# Patient Record
Sex: Female | Born: 2000 | Hispanic: Yes | Marital: Single | Smoking: Never smoker
Health system: Southern US, Community
[De-identification: ages and names within clinical notes are randomized; demographics above are authoritative.]

## PROBLEM LIST (undated history)

## (undated) ENCOUNTER — Inpatient Hospital Stay: Payer: Self-pay

## (undated) DIAGNOSIS — Z789 Other specified health status: Secondary | ICD-10-CM

## (undated) HISTORY — PX: NO PAST SURGERIES: SHX2092

---
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
## 2020-05-07 ENCOUNTER — Other Ambulatory Visit: Payer: Self-pay

## 2020-05-07 ENCOUNTER — Ambulatory Visit: Payer: Medicaid Other | Admitting: Podiatry

## 2020-05-07 DIAGNOSIS — R102 Pelvic and perineal pain: Secondary | ICD-10-CM | POA: Insufficient documentation

## 2022-12-13 ENCOUNTER — Emergency Department
Admission: EM | Admit: 2022-12-13 | Discharge: 2022-12-13 | Disposition: A | Discharge status: Home / Self Care | Payer: Medicaid Other | Attending: Emergency Medicine | Admitting: Emergency Medicine

## 2022-12-13 ENCOUNTER — Other Ambulatory Visit: Payer: Self-pay

## 2022-12-13 DIAGNOSIS — L509 Urticaria, unspecified: Secondary | ICD-10-CM | POA: Diagnosis not present

## 2022-12-13 DIAGNOSIS — T7840XA Allergy, unspecified, initial encounter: Secondary | ICD-10-CM | POA: Diagnosis present

## 2022-12-13 DIAGNOSIS — T782XXA Anaphylactic shock, unspecified, initial encounter: Secondary | ICD-10-CM

## 2022-12-13 MED ORDER — DIPHENHYDRAMINE HCL 25 MG PO CAPS
50.0000 mg | ORAL_CAPSULE | Freq: Once | ORAL | Status: AC
  Administered 2022-12-13: 50 mg via ORAL
  Filled 2022-12-13: qty 2

## 2022-12-13 MED ORDER — EPINEPHRINE 0.3 MG/0.3ML IJ SOAJ: 0.3000 mg | INTRAMUSCULAR | 1 refills | Status: AC | PRN

## 2022-12-13 MED ORDER — EPINEPHRINE 0.3 MG/0.3ML IJ SOAJ
0.3000 mg | Freq: Once | INTRAMUSCULAR | Status: AC
  Administered 2022-12-13: 0.3 mg via INTRAMUSCULAR
  Filled 2022-12-13: qty 0.3

## 2022-12-13 MED ORDER — DEXAMETHASONE SODIUM PHOSPHATE 10 MG/ML IJ SOLN
10.0000 mg | Freq: Once | INTRAMUSCULAR | Status: AC
  Administered 2022-12-13: 10 mg via INTRAMUSCULAR
  Filled 2022-12-13: qty 1

## 2022-12-13 NOTE — ED Notes (Signed)
ED Provider at bedside. 

## 2022-12-13 NOTE — ED Triage Notes (Addendum)
Pt reports waking up tonight with itching all over and hives. Pt has swelling to left side upper lip. Pt reports feeling like throat is swollen, no obvious swelling noted and voice is clear. Pt reports this started 30 min ago and denies new food or products.

## 2022-12-13 NOTE — ED Provider Notes (Signed)
[facility] Provider Note    Event Date/Time   First MD Initiated Contact with Patient 12/13/22 [phone]     (approximate)   History   Allergic Reaction   HPI  Misty Cummings is a 22 y.o. female who presents to the ED for evaluation of Allergic Reaction   Patient presents to the ED alongside her brother for evaluation of of upper lip swelling, hives, shortness of breath and throat closure sensation.  This has never happened before.  She felt normal when she went to bed, but awoke overnight with the symptoms.   Physical Exam   Triage Vital Signs: ED Triage Vitals  Encounter Vitals Group     BP 12/13/22 0220 103/60     Systolic BP Percentile --      Diastolic BP Percentile --      Pulse Rate 12/13/22 0220 71     Resp 12/13/22 0220 18     Temp 12/13/22 0220 98.4 F (36.9 C)     Temp src --      SpO2 12/13/22 0220 100 %     Weight 12/13/22 0221 163 lb (73.9 kg)     Height 12/13/22 0221 5\' 4"  (1.626 m)     Head Circumference --      Peak Flow --      Pain Score 12/13/22 0220 0     Pain Loc --      Pain Education --      Exclude from Growth Chart --     Most recent vital signs: Vitals:   12/13/22 0300 12/13/22 0521  BP: (!) 104/54 (!) 91/56  Pulse: 69 66  Resp: 17 16  Temp:  98.4 F (36.9 C)  SpO2: 100% 96%    General: Awake, no distress.  CV:  Good peripheral perfusion.  Resp:  Normal effort.  No wheezing Abd:  No distention.  Soft and benign MSK:  No deformity noted.  Neuro:  No focal deficits appreciated. Other:  Scattered hives to the extremities.  Localized area of swelling to the left upper lip without signs of upper airway obstruction.  Normal voice but she frequently swallows and says it feels unusual   ED Results / Procedures / Treatments   Labs (all labs ordered are listed, but only abnormal results are displayed) Labs Reviewed - No data to display  EKG   RADIOLOGY   Official radiology report(s): No  results found.  PROCEDURES and INTERVENTIONS:  .1-3 Lead EKG Interpretation  Performed by: Delton Prairie, MD Authorized by: Delton Prairie, MD     Interpretation: normal     ECG rate:  66   ECG rate assessment: normal     Rhythm: sinus rhythm     Ectopy: none     Conduction: normal   .Critical Care  Performed by: Delton Prairie, MD Authorized by: Delton Prairie, MD   Critical care provider statement:    Critical care time (minutes):  30   Critical care time was exclusive of:  Separately billable procedures and treating other patients   Critical care was necessary to treat or prevent imminent or life-threatening deterioration of the following conditions:  Toxidrome   Critical care was time spent personally by me on the following activities:  Development of treatment plan with patient or surrogate, discussions with consultants, evaluation of patient's response to treatment, examination of patient, ordering and review of laboratory studies, ordering and review of radiographic studies, ordering and performing treatments and interventions, pulse oximetry,  re-evaluation of patient's condition and review of old charts   Medications  EPINEPHrine (EPI-PEN) injection 0.3 mg (0.3 mg Intramuscular Given 12/13/22 0248)  diphenhydrAMINE (BENADRYL) capsule 50 mg (50 mg Oral Given 12/13/22 0248)  dexamethasone (DECADRON) injection 10 mg (10 mg Intramuscular Given 12/13/22 0248)     IMPRESSION / MDM / ASSESSMENT AND PLAN / ED COURSE  I reviewed the triage vital signs and the nursing notes.  Differential diagnosis includes, but is not limited to, atopic reaction, angioedema, anaphylaxis  {Patient presents with symptoms of an acute illness or injury that is potentially life-threatening.  Patient presents with evidence of anaphylaxis requiring an EpiPen and subsequent observation.  Multiple organs involved but no distress.  Symptoms resolved quickly with EpiPen and I doubt angioedema.  We will observe for a  few hours to ensure no need for repeat dosing but I suspect she will be suitable for outpatient management.  Clinical Course as of 12/13/22 8413  [name] Dec 13, 2022  2440 Reassessed, symptoms resolving with the EpiPen.  She was appreciative [DS]  0508 Reassessed, feeling well and suitable for outpatient management.  Discussed EpiPen, return precautions [DS]    Clinical Course User Index [DS] Delton Prairie, MD     FINAL CLINICAL IMPRESSION(S) / ED DIAGNOSES   Final diagnoses:  Anaphylaxis, initial encounter  Hives  Allergic reaction, initial encounter     Rx / DC Orders   ED Discharge Orders          Ordered    EPINEPHrine 0.3 mg/0.3 mL IJ SOAJ injection  As needed        12/13/22 0501             Note:  This document was prepared using Dragon voice recognition software and may include unintentional dictation errors.   Delton Prairie, MD 12/13/22 [phone]

## 2022-12-13 NOTE — Discharge Instructions (Addendum)
If you have any more mild itching or hives, you may need to take Benadryl a couple times over the next day or so.  Sent a prescription for EpiPen to the pharmacy for you to keep around the house  If your symptoms return or worsen then please return to the ED  If you ever use an EpiPen you should come to the ED to get observed

## 2022-12-13 NOTE — ED Notes (Signed)
Provided pt with discharge instructions and education. All of pt questions answered. Pt in possession of all belongings. Pt AAOX4 and stable at time of discharge.Pt ambulated w/ steady gait towards ED exit. Pt accompanied by family member.

## 2024-01-12 ENCOUNTER — Other Ambulatory Visit: Payer: Self-pay

## 2024-01-12 ENCOUNTER — Emergency Department

## 2024-01-12 ENCOUNTER — Emergency Department
Admission: EM | Admit: 2024-01-12 | Discharge: 2024-01-12 | Disposition: A | Discharge status: Home / Self Care | Attending: Emergency Medicine | Admitting: Emergency Medicine

## 2024-01-12 DIAGNOSIS — S6991XA Unspecified injury of right wrist, hand and finger(s), initial encounter: Secondary | ICD-10-CM | POA: Insufficient documentation

## 2024-01-12 DIAGNOSIS — Y92410 Unspecified street and highway as the place of occurrence of the external cause: Secondary | ICD-10-CM | POA: Insufficient documentation

## 2024-01-12 DIAGNOSIS — M79644 Pain in right finger(s): Secondary | ICD-10-CM | POA: Diagnosis present

## 2024-01-12 DIAGNOSIS — M545 Low back pain, unspecified: Secondary | ICD-10-CM | POA: Insufficient documentation

## 2024-01-12 MED ORDER — ACETAMINOPHEN 325 MG PO TABS
650.0000 mg | ORAL_TABLET | Freq: Once | ORAL | Status: AC
  Administered 2024-01-12: 650 mg via ORAL
  Filled 2024-01-12: qty 2
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
## 2024-01-12 NOTE — Discharge Instructions (Signed)
 You may wear the brace for extra support.  Please follow-up with your outpatient provider.  Please return for any new, worsening, or changing symptoms or other concerns.  It was a pleasure caring for you today.
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
## 2024-01-13 ENCOUNTER — Telehealth: Payer: Self-pay | Admitting: Emergency Medicine

## 2024-01-13 NOTE — Telephone Encounter (Addendum)
 Called again re possible finger fracture.  She did answer and I explained.  She is splinted already.  I gave her number for [facility] and she will call for appt.
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
## 2024-01-27 ENCOUNTER — Other Ambulatory Visit: Payer: Self-pay

## 2024-01-27 DIAGNOSIS — M25531 Pain in right wrist: Secondary | ICD-10-CM

## 2024-01-30 ENCOUNTER — Ambulatory Visit
Admission: RE | Admit: 2024-01-30 | Discharge: 2024-01-30 | Disposition: A | Discharge status: Home / Self Care | Source: Ambulatory Visit

## 2024-01-30 DIAGNOSIS — M25531 Pain in right wrist: Secondary | ICD-10-CM | POA: Insufficient documentation

## 2024-03-21 ENCOUNTER — Ambulatory Visit: Admitting: Podiatry
